# Patient Record
Sex: Male | Born: 1995 | Hispanic: No | Marital: Single | Smoking: Current some day smoker
Health system: Southern US, Community
[De-identification: ages and names within clinical notes are randomized; demographics above are authoritative.]

---
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
## 2015-10-29 ENCOUNTER — Emergency Department: Payer: Managed Care, Other (non HMO)

## 2015-10-29 ENCOUNTER — Emergency Department
Admission: EM | Admit: 2015-10-29 | Discharge: 2015-10-29 | Disposition: A | Discharge status: Home / Self Care | Payer: Managed Care, Other (non HMO) | Attending: Emergency Medicine | Admitting: Emergency Medicine

## 2015-10-29 DIAGNOSIS — F17200 Nicotine dependence, unspecified, uncomplicated: Secondary | ICD-10-CM | POA: Insufficient documentation

## 2015-10-29 DIAGNOSIS — Y9389 Activity, other specified: Secondary | ICD-10-CM | POA: Diagnosis not present

## 2015-10-29 DIAGNOSIS — S0990XA Unspecified injury of head, initial encounter: Secondary | ICD-10-CM | POA: Diagnosis present

## 2015-10-29 DIAGNOSIS — S161XXA Strain of muscle, fascia and tendon at neck level, initial encounter: Secondary | ICD-10-CM | POA: Insufficient documentation

## 2015-10-29 DIAGNOSIS — Y999 Unspecified external cause status: Secondary | ICD-10-CM | POA: Diagnosis not present

## 2015-10-29 DIAGNOSIS — S0003XA Contusion of scalp, initial encounter: Secondary | ICD-10-CM | POA: Diagnosis not present

## 2015-10-29 DIAGNOSIS — S60512A Abrasion of left hand, initial encounter: Secondary | ICD-10-CM | POA: Diagnosis not present

## 2015-10-29 DIAGNOSIS — Y92410 Unspecified street and highway as the place of occurrence of the external cause: Secondary | ICD-10-CM | POA: Diagnosis not present

## 2015-10-29 DIAGNOSIS — S0093XA Contusion of unspecified part of head, initial encounter: Secondary | ICD-10-CM

## 2015-10-29 MED ORDER — MELOXICAM 15 MG PO TABS: 15.0000 mg | ORAL_TABLET | Freq: Every day | ORAL | 0 refills | Status: AC

## 2015-10-29 MED ORDER — METHOCARBAMOL 500 MG PO TABS: 500.0000 mg | ORAL_TABLET | Freq: Four times a day (QID) | ORAL | 0 refills | Status: AC

## 2015-10-29 MED ORDER — TETANUS-DIPHTH-ACELL PERTUSSIS 5-2.5-18.5 LF-MCG/0.5 IM SUSP
0.5000 mL | Freq: Once | INTRAMUSCULAR | Status: AC
  Administered 2015-10-29: 0.5 mL via INTRAMUSCULAR
  Filled 2015-10-29: qty 0.5
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
## 2015-10-29 NOTE — ED Notes (Signed)
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
## 2018-11-04 ENCOUNTER — Ambulatory Visit (LOCAL_COMMUNITY_HEALTH_CENTER): Payer: Self-pay

## 2018-11-04 ENCOUNTER — Other Ambulatory Visit: Payer: Self-pay

## 2018-11-04 DIAGNOSIS — Z23 Encounter for immunization: Secondary | ICD-10-CM
